# Patient Record
Sex: Male | Born: 2005 | Race: White | Hispanic: No | Marital: Single | Smoking: Never smoker
Health system: Southern US, Community
[De-identification: ages and names within clinical notes are randomized; demographics above are authoritative.]

---
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
## 2024-08-22 ENCOUNTER — Other Ambulatory Visit: Payer: Self-pay

## 2024-08-22 ENCOUNTER — Emergency Department

## 2024-08-22 ENCOUNTER — Emergency Department
Admission: EM | Admit: 2024-08-22 | Discharge: 2024-08-22 | Disposition: A | Discharge status: Home / Self Care | Attending: Emergency Medicine | Admitting: Emergency Medicine

## 2024-08-22 DIAGNOSIS — R1031 Right lower quadrant pain: Secondary | ICD-10-CM | POA: Diagnosis present

## 2024-08-22 LAB — COMPREHENSIVE METABOLIC PANEL WITH GFR
ALT: 25 U/L (ref 0–44)
AST: 23 U/L (ref 15–41)
Albumin: 4.7 g/dL (ref 3.5–5.0)
Alkaline Phosphatase: 66 U/L (ref 38–126)
Anion gap: 9 (ref 5–15)
BUN: 22 mg/dL — ABNORMAL HIGH (ref 6–20)
CO2: 25 mmol/L (ref 22–32)
Calcium: 9.7 mg/dL (ref 8.9–10.3)
Chloride: 103 mmol/L (ref 98–111)
Creatinine, Ser: 1.05 mg/dL (ref 0.61–1.24)
GFR, Estimated: >60 mL/min (ref >60)
Glucose, Bld: 96 mg/dL (ref 70–99)
Potassium: 4.7 mmol/L (ref 3.5–5.1)
Sodium: 137 mmol/L (ref 135–145)
Total Bilirubin: 0.9 mg/dL (ref 0.0–1.2)
Total Protein: 7.5 g/dL (ref 6.5–8.1)

## 2024-08-22 LAB — CBC
HCT: 46.8 % (ref 39.0–52.0)
Hemoglobin: 15.3 g/dL (ref 13.0–17.0)
MCH: 27.7 pg (ref 26.0–34.0)
MCHC: 32.7 g/dL (ref 30.0–36.0)
MCV: 84.8 fL (ref 80.0–100.0)
Platelets: 331 K/uL (ref 150–400)
RBC: 5.52 MIL/uL (ref 4.22–5.81)
RDW: 13.3 % (ref 11.5–15.5)
WBC: 9.4 K/uL (ref 4.0–10.5)
nRBC: 0 % (ref 0.0–0.2)

## 2024-08-22 LAB — URINALYSIS, ROUTINE W REFLEX MICROSCOPIC
Bacteria, UA: NONE SEEN
Bilirubin Urine: NEGATIVE
Glucose, UA: NEGATIVE mg/dL
Ketones, ur: NEGATIVE mg/dL
Leukocytes,Ua: NEGATIVE
Nitrite: NEGATIVE
Protein, ur: NEGATIVE mg/dL
RBC / HPF: 0 RBC/hpf (ref 0–5)
Specific Gravity, Urine: 1.019 (ref 1.005–1.030)
Squamous Epithelial / HPF: 0 /HPF (ref 0–5)
pH: 5 (ref 5.0–8.0)

## 2024-08-22 LAB — LIPASE, BLOOD: Lipase: 25 U/L (ref 11–51)

## 2024-08-22 MED ORDER — IOHEXOL 300 MG/ML  SOLN
100.0000 mL | Freq: Once | INTRAMUSCULAR | Status: AC | PRN
  Administered 2024-08-22: 100 mL via INTRAVENOUS

## 2024-08-22 NOTE — Discharge Instructions (Addendum)
 Your blood work, urinalysis, and CT scan are overall reassuring.  You may continue to take Tylenol /ibuprofen per package instructions to help with your symptoms.  Please return for any new, worsening, or changing symptoms or other concerns.  It was a pleasure caring for you today.

## 2024-08-22 NOTE — Progress Notes (Signed)
 Patient presents to triage with complaints of right lower quadrant pain.  To ED for further evaluation and care.

## 2024-08-22 NOTE — ED Provider Notes (Signed)
 [facility] Provider Note    Event Date/Time   First MD Initiated Contact with Patient 08/22/24 1313     (approximate)   History   Abdominal Pain   HPI  Oscar Hogan is a 18 y.o. male who presents today for evaluation of right lower quadrant pain.  Symptoms began several days ago as a dull ache and now are more bothersome.  He denies any radiation of pain.  He denies any burning with urination or blood in his urine.  He reports that he feels more constipated, though last moved his bowels last night.  No nausea or vomiting.  No history of kidney stones.  No fevers or chills.  Patient Active Problem List   Diagnosis Date Noted   Syncope and collapse 08/29/2018          Physical Exam   Triage Vital Signs: ED Triage Vitals [08/22/24 1247]  Encounter Vitals Group     BP 113/61     Girls Systolic BP Percentile      Girls Diastolic BP Percentile      Boys Systolic BP Percentile      Boys Diastolic BP Percentile      Pulse Rate 68     Resp 18     Temp 98 F (36.7 C)     Temp src      SpO2 100 %     Weight      Height      Head Circumference      Peak Flow      Pain Score      Pain Loc      Pain Education      Exclude from Growth Chart     Most recent vital signs: Vitals:   08/22/24 1247  BP: 113/61  Pulse: 68  Resp: 18  Temp: 98 F (36.7 C)  SpO2: 100%    Physical Exam Vitals and nursing note reviewed.  Constitutional:      General: Awake and alert. No acute distress.    Appearance: Normal appearance. The patient is normal weight.  HENT:     Head: Normocephalic and atraumatic.     Mouth: Mucous membranes are moist.  Eyes:     General: PERRL. Normal EOMs        Right eye: No discharge.        Left eye: No discharge.     Conjunctiva/sclera: Conjunctivae normal.  Cardiovascular:     Rate and Rhythm: Normal rate and regular rhythm.     Pulses: Normal pulses.  Pulmonary:     Effort: Pulmonary effort is normal. No  respiratory distress.     Breath sounds: Normal breath sounds.  Abdominal:     Abdomen is soft. There is very mild right lower quadrant abdominal tenderness. No rebound or guarding. No distention.  No CVA tenderness Musculoskeletal:        General: No swelling. Normal range of motion.     Cervical back: Normal range of motion and neck supple.  Skin:    General: Skin is warm and dry.     Capillary Refill: Capillary refill takes less than 2 seconds.     Findings: No rash.  Neurological:     Mental Status: The patient is awake and alert.      ED Results / Procedures / Treatments   Labs (all labs ordered are listed, but only abnormal results are displayed) Labs Reviewed  COMPREHENSIVE METABOLIC PANEL WITH GFR - Abnormal;  Notable for the following components:      Result Value   BUN 22 (*)    All other components within normal limits  URINALYSIS, ROUTINE W REFLEX MICROSCOPIC - Abnormal; Notable for the following components:   Color, Urine YELLOW (*)    APPearance CLEAR (*)    Hgb urine dipstick SMALL (*)    All other components within normal limits  LIPASE, BLOOD  CBC     EKG     RADIOLOGY I independently reviewed and interpreted imaging and agree with radiologists findings.     PROCEDURES:  Critical Care performed:   Procedures   MEDICATIONS ORDERED IN ED: Medications  iohexol (OMNIPAQUE) 300 MG/ML solution 100 mL (100 mLs Intravenous Contrast Given 08/22/24 1431)     IMPRESSION / MDM / ASSESSMENT AND PLAN / ED COURSE  I reviewed the triage vital signs and the nursing notes.   Differential diagnosis includes, but is not limited to, appendicitis, ureteral colic, muscle strain, lymphadenopathy.  Patient is awake and alert, hemodynamically stable and afebrile.  He has minimal reproducible tenderness in his right lower quadrant.  No rebound or guarding.  Further workup as indicated.  Labs obtained are overall reassuring, no leukocytosis, normal LFTs and  lipase, normal creatinine.  Urinalysis is overall reassuring, no evidence of infection.  There was small hemoglobin noted, though 0 RBCs.  Denies any strenuous workouts to suggest rhabdomyolysis.  Creatinine is within normal limits.  CT scan obtained is normal.  No intra-abdominal abnormality.  Patient and family is reassured by this.  He was offered analgesia but declined.  We discussed symptomatic management and return precautions.  Patient and family understand and agree with plan.  Discharged in stable condition.    Patient's presentation is most consistent with acute complicated illness / injury requiring diagnostic workup.  FINAL CLINICAL IMPRESSION(S) / ED DIAGNOSES   Final diagnoses:  Right lower quadrant abdominal pain     Rx / DC Orders   ED Discharge Orders     None        Note:  This document was prepared using Dragon voice recognition software and may include unintentional dictation errors.   Lorianne Malbrough E, PA-C 08/22/24 1726    Arlander Charleston, MD 08/23/24 1136

## 2024-08-22 NOTE — ED Triage Notes (Addendum)
 Pt comes with c/o Right lower quad pain. Pt brought over from [facility]. Pt states no N/V. Pt states he might have pulled something also bc he does lift a lot. Pt denies any pain or burning with urination.

## 2024-08-22 NOTE — ED Notes (Signed)
 See triage note  Presents with abd pain  States the pain started several days ago  But is worse today  and on the right side Afebrile on arrival
